# Patient Record
Sex: Female | Born: 1970 | Race: White | Hispanic: No | Marital: Married
Health system: Southern US, Community
[De-identification: ages and names within clinical notes are randomized; demographics above are authoritative.]

---

## 1998-05-07 ENCOUNTER — Inpatient Hospital Stay (HOSPITAL_COMMUNITY): Admission: AD | Admit: 1998-05-07 | Discharge: 1998-05-07 | Payer: Self-pay | Admitting: Obstetrics and Gynecology

## 1998-07-21 ENCOUNTER — Inpatient Hospital Stay (HOSPITAL_COMMUNITY): Admission: AD | Admit: 1998-07-21 | Discharge: 1998-07-21 | Payer: Self-pay | Admitting: Obstetrics and Gynecology

## 1998-07-24 ENCOUNTER — Inpatient Hospital Stay (HOSPITAL_COMMUNITY): Admission: AD | Admit: 1998-07-24 | Discharge: 1998-07-24 | Payer: Self-pay | Admitting: Obstetrics & Gynecology

## 1998-08-04 ENCOUNTER — Observation Stay (HOSPITAL_COMMUNITY): Admission: AD | Admit: 1998-08-04 | Discharge: 1998-08-05 | Payer: Self-pay | Admitting: Obstetrics and Gynecology

## 1998-08-06 ENCOUNTER — Inpatient Hospital Stay (HOSPITAL_COMMUNITY): Admission: AD | Admit: 1998-08-06 | Discharge: 1998-08-09 | Payer: Self-pay | Admitting: Obstetrics and Gynecology

## 1999-12-21 ENCOUNTER — Ambulatory Visit (HOSPITAL_COMMUNITY): Admission: RE | Admit: 1999-12-21 | Discharge: 1999-12-21 | Payer: Self-pay | Admitting: Unknown Physician Specialty

## 1999-12-21 ENCOUNTER — Encounter: Payer: Self-pay | Admitting: Obstetrics and Gynecology

## 2001-01-16 ENCOUNTER — Other Ambulatory Visit: Admission: RE | Admit: 2001-01-16 | Discharge: 2001-01-16 | Payer: Self-pay | Admitting: Obstetrics and Gynecology

## 2001-01-30 ENCOUNTER — Ambulatory Visit (HOSPITAL_COMMUNITY): Admission: RE | Admit: 2001-01-30 | Discharge: 2001-01-30 | Payer: Self-pay | Admitting: Obstetrics and Gynecology

## 2001-01-30 ENCOUNTER — Encounter: Payer: Self-pay | Admitting: Obstetrics and Gynecology

## 2002-08-28 ENCOUNTER — Other Ambulatory Visit: Admission: RE | Admit: 2002-08-28 | Discharge: 2002-08-28 | Payer: Self-pay | Admitting: Obstetrics and Gynecology

## 2002-11-12 ENCOUNTER — Inpatient Hospital Stay (HOSPITAL_COMMUNITY): Admission: AD | Admit: 2002-11-12 | Discharge: 2002-11-12 | Payer: Self-pay | Admitting: Obstetrics and Gynecology

## 2003-01-26 ENCOUNTER — Inpatient Hospital Stay (HOSPITAL_COMMUNITY): Admission: AD | Admit: 2003-01-26 | Discharge: 2003-01-26 | Payer: Self-pay | Admitting: Obstetrics and Gynecology

## 2003-03-05 ENCOUNTER — Inpatient Hospital Stay (HOSPITAL_COMMUNITY): Admission: AD | Admit: 2003-03-05 | Discharge: 2003-03-05 | Payer: Self-pay | Admitting: Obstetrics and Gynecology

## 2003-03-14 ENCOUNTER — Inpatient Hospital Stay (HOSPITAL_COMMUNITY): Admission: AD | Admit: 2003-03-14 | Discharge: 2003-03-14 | Payer: Self-pay | Admitting: Obstetrics and Gynecology

## 2003-03-16 ENCOUNTER — Inpatient Hospital Stay (HOSPITAL_COMMUNITY): Admission: AD | Admit: 2003-03-16 | Discharge: 2003-03-18 | Payer: Self-pay | Admitting: Obstetrics and Gynecology

## 2003-04-16 ENCOUNTER — Other Ambulatory Visit: Admission: RE | Admit: 2003-04-16 | Discharge: 2003-04-16 | Payer: Self-pay | Admitting: Obstetrics and Gynecology

## 2005-03-13 ENCOUNTER — Emergency Department (HOSPITAL_COMMUNITY): Admission: EM | Admit: 2005-03-13 | Discharge: 2005-03-13 | Payer: Self-pay | Admitting: Emergency Medicine

## 2005-03-30 ENCOUNTER — Encounter: Admission: RE | Admit: 2005-03-30 | Discharge: 2005-03-30 | Payer: Self-pay | Admitting: Internal Medicine

## 2005-09-17 ENCOUNTER — Emergency Department (HOSPITAL_COMMUNITY): Admission: EM | Admit: 2005-09-17 | Discharge: 2005-09-17 | Payer: Self-pay | Admitting: *Deleted

## 2005-10-13 ENCOUNTER — Encounter (INDEPENDENT_AMBULATORY_CARE_PROVIDER_SITE_OTHER): Payer: Self-pay | Admitting: Specialist

## 2005-10-13 ENCOUNTER — Ambulatory Visit (HOSPITAL_COMMUNITY): Admission: RE | Admit: 2005-10-13 | Discharge: 2005-10-13 | Payer: Self-pay | Admitting: Surgery

## 2005-11-16 ENCOUNTER — Ambulatory Visit (HOSPITAL_COMMUNITY): Admission: RE | Admit: 2005-11-16 | Discharge: 2005-11-16 | Payer: Self-pay | Admitting: Urology

## 2006-02-08 ENCOUNTER — Other Ambulatory Visit: Admission: RE | Admit: 2006-02-08 | Discharge: 2006-02-08 | Payer: Self-pay | Admitting: Obstetrics and Gynecology

## 2006-03-14 IMAGING — CR DG ABDOMEN 1V
2 series · 2 of 2 positions shown · non-contrast
Comparison: none

HISTORY: Renal calculi, pre lithotripsy for left renal stone

ABDOMEN ONE VIEW:
Small calculus lower pole left kidney, 4 mm greatest size.
Tiny right renal calculi also noted.
Surgical clips right upper quadrant.
No ureteral calcifications evident.
Bowel gas pattern normal.
Bones unremarkable.

[t abdomen supine (1 of 2)]
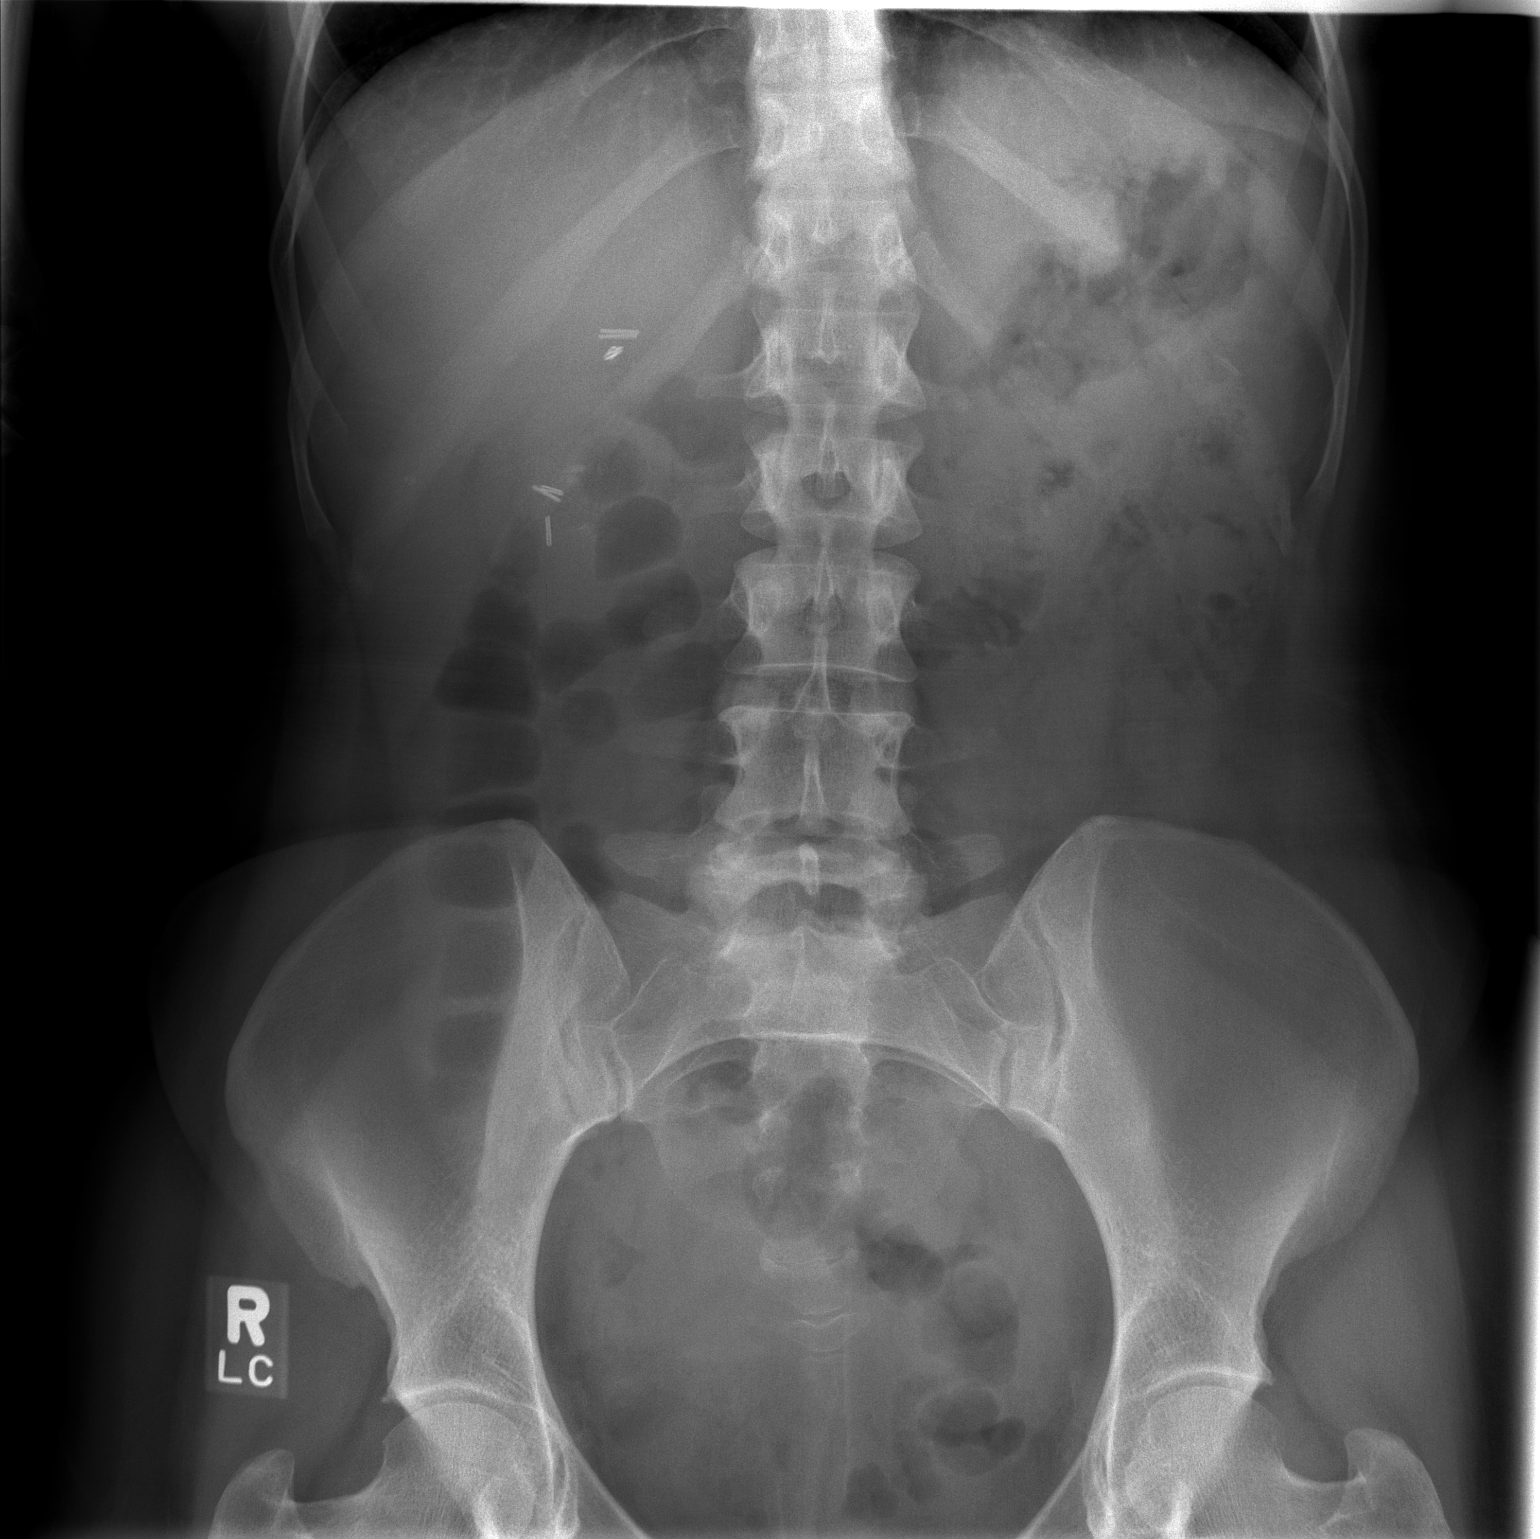

[t abdomen supine (2 of 2)]
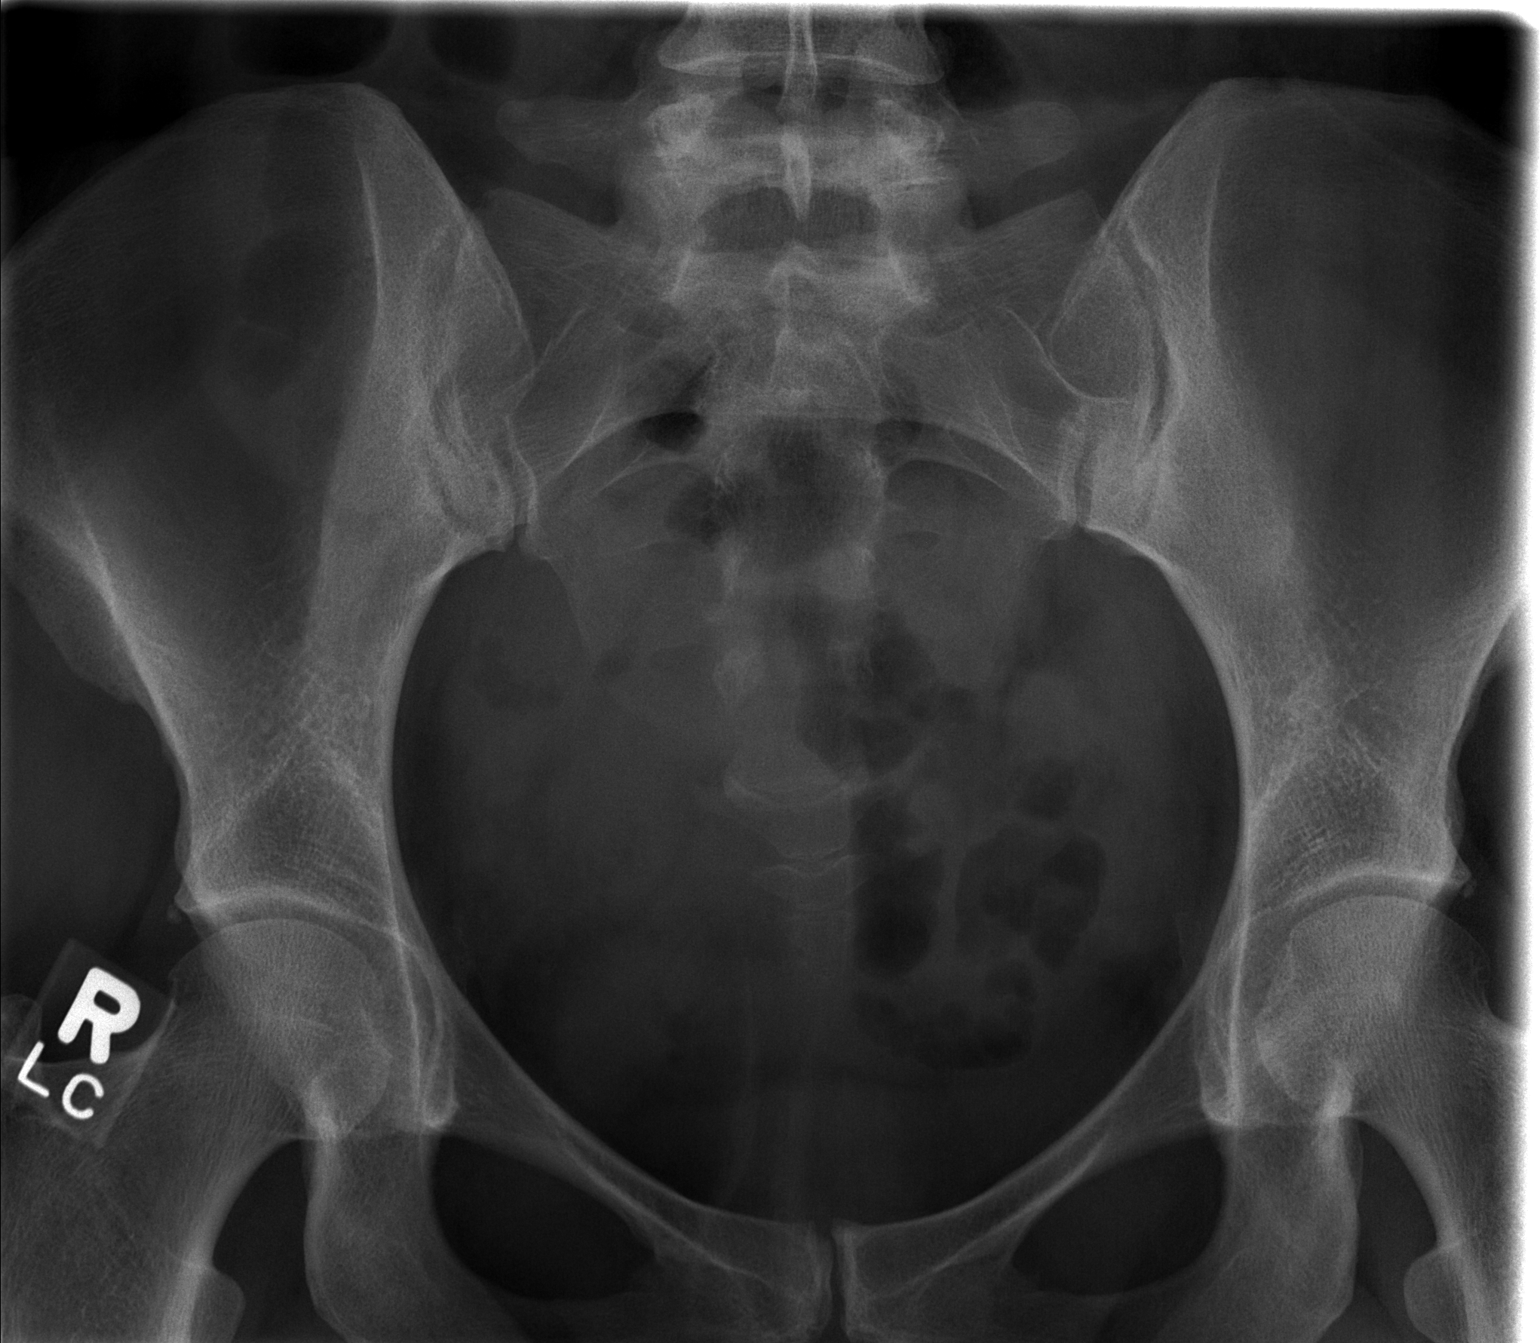

[2 of 2 positions shown; findings below may reference images not displayed]

IMPRESSION: Small bilateral renal calculi.

## 2008-02-12 ENCOUNTER — Encounter: Admission: RE | Admit: 2008-02-12 | Discharge: 2008-02-12 | Payer: Self-pay | Admitting: Family Medicine

## 2008-06-25 ENCOUNTER — Ambulatory Visit (HOSPITAL_COMMUNITY): Admission: RE | Admit: 2008-06-25 | Discharge: 2008-06-26 | Payer: Self-pay | Admitting: Obstetrics and Gynecology

## 2008-06-25 ENCOUNTER — Encounter (INDEPENDENT_AMBULATORY_CARE_PROVIDER_SITE_OTHER): Payer: Self-pay | Admitting: Obstetrics and Gynecology

## 2011-04-11 NOTE — Op Note (Signed)
Danielle Simpson, HALPERIN               ACCOUNT NO.:  [id number]   MEDICAL RECORD NO.:  [id number]          PATIENT TYPE:  INP   LOCATION:  9309                          FACILITY:  [facility]   PHYSICIAN:  Michelle L. Grewal, M.D.DATE OF BIRTH:  1971/11/17   DATE OF PROCEDURE:  06/25/2008  DATE OF DISCHARGE:                               OPERATIVE REPORT   PREOPERATIVE DIAGNOSES:  Pelvic pain and pelvic congestion.   POSTOPERATIVE DIAGNOSES:  Pelvic pain and pelvic congestion.   PROCEDURE:  Laparoscopic-assisted vaginal hysterectomy and left salpingo-  oophorectomy.   SURGEON:  Michelle L. Vincente Poli, MD.   ASSISTANT:  Zelphia Cairo, MD.   ANESTHESIA:  General.   FINDINGS:  Questionable adenomyosis and a varicosity of the left ovarian  vein.   SPECIMENS:  Uterus, cervix, left tube, and ovary sent to pathology.   ESTIMATED BLOOD LOSS:  100 mL.   COMPLICATIONS:  None.   DESCRIPTION OF PROCEDURE:  The patient was taken to the operating room.  She was intubated.  She was prepped and draped.  A Foley catheter was  inserted, a small infraumbilical incision was made, a Veress needle was  inserted, and pneumoperitoneum was performed.  The laparoscope was  introduced through the trocar sheath.  A small 5-mm trocar was placed  suprapubically under direct visualization.  Exam of the abdomen and  pelvis revealed the following, that the uterus was boggy, consistent  with adenomyosis.  No fibroids were seen.  No endometriosis was seen.  Right tube and ovary appeared normal.  She did have dilated veins around  the lower uterine segment and the cervix.  I did notice her left ovary  appeared normal, but she did have a dilated left ovarian vein.  This  seems to be consistent with ultrasound that was done preoperatively and  with her diagnosis of pelvic congestion.  I grasped the right tube and  ovary, and placed the EnSeal instrument just across the triple pedicle,  burned that and carried it down to  the round ligament with excellent  hemostasis.  On the left side, since we were removing her left tube and  ovary, identified the ureter, which was well away from my instruments.  Placed the EnSeal across the left IP and carried that down to the round  ligament with excellent hemostasis.  After this was done, we then  converted vaginally where a circumferential incision was made around the  cervix.  The posterior cul-de-sac was entered sharply using Mayo  scissors and the anterior cul-de-sac was entered sharply using  Metzenbaum scissors.  Curved Heaney clamps were placed across each  uterosacral cardinal ligament on either side.  Each pedicle was suture  ligated using 0 Vicryl suture.  We walked our way up the broad ligament  on each side, staying just beside the cervix and the uterus.  Each  pedicle was suture ligated using 0 Vicryl suture.  Once we reached the  level of the fundus, the uterus was retroflexed and delivered easily.  The remainder of the broad ligament was clamped on either side using  curved Heaney clamps and each  specimen was removed.  The pedicles were  secured using a free tie suture ligature of 0 Vicryl suture.  Hemostasis  was excellent.  The posterior cuff was closed from 3 to 9 o'clock in a  running locked stitch using 0 Vicryl.  The cuff was then closed  completely, anterior to posterior using 0 Vicryl in a running locked  stitch.  At this point, I changed my gloves and went back to the abdomen  and replaced the pneumoperitoneum, inserted the trocar, placed the  patient in Trendelenburg position, and irrigated the pelvis.  No  bleeding was noted from any site whatsoever.  The trocars were removed  after pneumoperitoneum was released.  The incision at the umbilicus was  closed with a 2-0 Vicryl in a figure-of-eight and then the skin was  closed at both sides with Dermabond skin adhesive.  All sponge, lap, and  instrument counts were correct x2.  The patient went to  recovery room in  stable condition.      Michelle L. Vincente Poli, M.D.  Electronically Signed     MLG/MEDQ  D:  06/25/2008  T:  06/25/2008  Job:  [id number]

## 2011-04-14 NOTE — Op Note (Signed)
Danielle Simpson, Danielle Simpson               ACCOUNT NO.:  [id number]   MEDICAL RECORD NO.:  [id number]          PATIENT TYPE:  AMB   LOCATION:  DAY                          FACILITY:  [facility]   PHYSICIAN:  Wilmon Arms. Corliss Skains, M.D. DATE OF BIRTH:  18-Jun-1971   DATE OF PROCEDURE:  10/13/2005  DATE OF DISCHARGE:                                 OPERATIVE REPORT   PREOPERATIVE DIAGNOSIS:  Biliary dyskinesia.   POSTOPERATIVE DIAGNOSIS:  Biliary dyskinesia.   PROCEDURE PERFORMED:  Laparoscopic cholecystectomy.   SURGEON:  Dr. Corliss Skains   ASSISTANT:  Dr. Thornton Dales   ANESTHESIA:  General endotracheal.   INDICATIONS:  The patient is a 40 year old female, who has had severe upper  abdominal pain associated with nausea.  She was evaluated with an ultrasound  which was normal.  She has followed up with Dr. Marny Lowenstein, who ordered a CCK  HIDA scan.  Her symptoms worsened with the CCK injection, and her  gallbladder ejection fraction was only 13%.  We discussed these findings in  the office, and I recommended elective laparoscopic cholecystectomy.   DESCRIPTION OF PROCEDURE:  The patient is brought to the operating room,  placed in supine position on the operating room table.  After an adequate  level of general endotracheal anesthesia was obtained, the patient's abdomen  was prepped with Betadine and draped in a sterile fashion.  She had been  given preoperative antibiotics.  The area just below her umbilicus was  infiltrated with 0.25% Marcaine.  She had a previous incision from a tubal  ligation.  We opened this incision, dissected down to the fascia.  The  fascia was opened vertically, and the peritoneal cavity was entered.  Zero  Vicryl was used to create a pursestring suture around the fascial opening.  The Hasson cannula was then inserted and secured with stay sutures.  Pneumoperitoneum was obtained by insufflating CO2, maintaining a maximum  pressure of 15 mmHg.  The patient was rotated in reverse  Trendelenburg  position, rotated to her left.  A 10 mm port was placed in the subxiphoid  position.  Two 5 mm ports were placed in the right upper quadrant.  There  were some filmy adhesions to the surface of the gallbladder.  These were  taken down with cautery.  There was a small bleeding vessel in the omentum  that was clipped.  The cystic duct was then circumferentially dissected,  ligated with clips, and divided.  We identified the cystic artery,  circumferentially dissected it, and ligated with clips and divided it.  Cautery was then used to remove the gallbladder from the liver bed.  Hemostasis was obtained with cautery.  The gallbladder was then removed  through the umbilical port.  Inspection of the liver bed showed no sign of  bleeding.  The ports were then removed under direct vision while releasing  the pneumoperitoneum.  The stay suture was used to close the umbilical  fascia.  All the port sites were infiltrated with  0.25% Marcaine.  Then 4-0 Monocryl was used to close the skin in  subcuticular fashion.  Steri-Strips and  clean dressings were applied.  The  patient was extubated and brought to the recovery room in stable condition.  All sponge, needle, and instrument counts were correct.      Wilmon Arms. Tsuei, M.D.  Electronically Signed     MKT/MEDQ  D:  10/13/2005  T:  10/13/2005  Job:  [id number]

## 2011-08-25 LAB — CBC
MCHC: 32.8
MCV: 80.4
Platelets: 201
RBC: 3.55 — ABNORMAL LOW
WBC: 10.7 — ABNORMAL HIGH

## 2012-01-29 ENCOUNTER — Other Ambulatory Visit: Payer: Self-pay | Admitting: Family Medicine

## 2012-01-29 DIAGNOSIS — R1310 Dysphagia, unspecified: Secondary | ICD-10-CM

## 2012-01-31 ENCOUNTER — Ambulatory Visit
Admission: RE | Admit: 2012-01-31 | Discharge: 2012-01-31 | Disposition: A | Discharge status: Home / Self Care | Payer: Managed Care, Other (non HMO) | Source: Ambulatory Visit | Attending: Family Medicine | Admitting: Family Medicine

## 2012-01-31 DIAGNOSIS — R1310 Dysphagia, unspecified: Secondary | ICD-10-CM

## 2012-12-26 ENCOUNTER — Other Ambulatory Visit: Payer: Self-pay | Admitting: Obstetrics and Gynecology

## 2014-02-02 ENCOUNTER — Other Ambulatory Visit: Payer: Self-pay | Admitting: Physician Assistant

## 2014-06-11 ENCOUNTER — Other Ambulatory Visit: Payer: Self-pay | Admitting: Obstetrics and Gynecology

## 2014-06-12 LAB — CYTOLOGY - PAP

## 2014-09-23 ENCOUNTER — Other Ambulatory Visit: Payer: Self-pay | Admitting: Dermatology

## 2019-10-08 ENCOUNTER — Other Ambulatory Visit: Payer: Self-pay

## 2019-10-08 DIAGNOSIS — Z20822 Contact with and (suspected) exposure to covid-19: Secondary | ICD-10-CM

## 2019-10-10 LAB — NOVEL CORONAVIRUS, NAA: SARS-CoV-2, NAA: NOT DETECTED

## 2020-02-01 ENCOUNTER — Ambulatory Visit: Payer: Managed Care, Other (non HMO) | Attending: Internal Medicine

## 2020-02-01 DIAGNOSIS — Z23 Encounter for immunization: Secondary | ICD-10-CM

## 2020-02-01 NOTE — Progress Notes (Signed)
   Covid-19 Vaccination Clinic  Name:  Danielle Simpson    MRN: [id number] DOB: [birth year]  02/01/2020  [name] was observed post Covid-19 immunization for 15 minutes without incident. She was provided with Vaccine Information Sheet and instruction to access the V-Safe system.   [name] was instructed to call 911 with any severe reactions post vaccine: Marland Kitchen Difficulty breathing  . Swelling of face and throat  . A fast heartbeat  . A bad rash all over body  . Dizziness and weakness   Immunizations Administered    Name Date Dose VIS Date Route   Pfizer COVID-19 Vaccine 02/01/2020  4:15 PM 0.3 mL 11/07/2019 Intramuscular   Manufacturer: [employer], [employer]   Lot: [id number]   NDC: [address]-5

## 2020-02-22 ENCOUNTER — Ambulatory Visit: Payer: Managed Care, Other (non HMO) | Attending: Internal Medicine

## 2020-02-22 DIAGNOSIS — Z23 Encounter for immunization: Secondary | ICD-10-CM

## 2020-02-22 NOTE — Progress Notes (Signed)
   Covid-19 Vaccination Clinic  Name:  Danielle Simpson    MRN: [id number] DOB: [birth year]  02/22/2020  Danielle Simpson was observed post Covid-19 immunization for 15 minutes without incident. She was provided with Vaccine Information Sheet and instruction to access the V-Safe system.   Danielle Simpson was instructed to call 911 with any severe reactions post vaccine: Marland Kitchen Difficulty breathing  . Swelling of face and throat  . A fast heartbeat  . A bad rash all over body  . Dizziness and weakness   Immunizations Administered    Name Date Dose VIS Date Route   Pfizer COVID-19 Vaccine 02/22/2020  2:09 PM 0.3 mL 11/07/2019 Intramuscular   Manufacturer: [employer], [employer]   Lot: [id number]   NDC: [address]-3

## 2020-06-07 ENCOUNTER — Telehealth: Payer: Self-pay

## 2020-06-07 NOTE — Telephone Encounter (Signed)
NOTES ON FILE FROM MICHELLE GREWAL [phone] SENT REFERRAL TO SCHEDULING 

## 2020-07-01 ENCOUNTER — Ambulatory Visit: Payer: Managed Care, Other (non HMO) | Admitting: Cardiovascular Disease

## 2020-07-30 ENCOUNTER — Ambulatory Visit: Payer: Managed Care, Other (non HMO) | Admitting: Cardiovascular Disease

## 2020-11-25 ENCOUNTER — Other Ambulatory Visit: Payer: Managed Care, Other (non HMO)

## 2021-01-24 ENCOUNTER — Other Ambulatory Visit: Payer: Self-pay | Admitting: Obstetrics and Gynecology

## 2021-01-24 DIAGNOSIS — R928 Other abnormal and inconclusive findings on diagnostic imaging of breast: Secondary | ICD-10-CM

## 2021-02-02 ENCOUNTER — Encounter: Payer: Self-pay | Admitting: General Practice

## 2021-02-04 ENCOUNTER — Other Ambulatory Visit: Payer: Self-pay

## 2021-02-04 ENCOUNTER — Ambulatory Visit
Admission: RE | Admit: 2021-02-04 | Discharge: 2021-02-04 | Disposition: A | Discharge status: Home / Self Care | Payer: Managed Care, Other (non HMO) | Source: Ambulatory Visit | Attending: Obstetrics and Gynecology | Admitting: Obstetrics and Gynecology

## 2021-02-04 ENCOUNTER — Other Ambulatory Visit: Payer: Self-pay | Admitting: Obstetrics and Gynecology

## 2021-02-04 ENCOUNTER — Ambulatory Visit
Admission: RE | Admit: 2021-02-04 | Discharge: 2021-02-04 | Disposition: A | Discharge status: Home / Self Care | Payer: BC Managed Care – PPO | Source: Ambulatory Visit | Attending: Obstetrics and Gynecology | Admitting: Obstetrics and Gynecology

## 2021-02-04 DIAGNOSIS — R928 Other abnormal and inconclusive findings on diagnostic imaging of breast: Secondary | ICD-10-CM

## 2021-08-09 ENCOUNTER — Other Ambulatory Visit: Payer: Self-pay

## 2021-08-09 ENCOUNTER — Other Ambulatory Visit: Payer: Self-pay | Admitting: Obstetrics and Gynecology

## 2021-08-09 ENCOUNTER — Ambulatory Visit
Admission: RE | Admit: 2021-08-09 | Discharge: 2021-08-09 | Disposition: A | Discharge status: Home / Self Care | Payer: BC Managed Care – PPO | Source: Ambulatory Visit | Attending: Obstetrics and Gynecology | Admitting: Obstetrics and Gynecology

## 2021-08-09 DIAGNOSIS — R928 Other abnormal and inconclusive findings on diagnostic imaging of breast: Secondary | ICD-10-CM

## 2022-02-07 ENCOUNTER — Ambulatory Visit
Admission: RE | Admit: 2022-02-07 | Discharge: 2022-02-07 | Disposition: A | Discharge status: Home / Self Care | Payer: BC Managed Care – PPO | Source: Ambulatory Visit | Attending: Obstetrics and Gynecology | Admitting: Obstetrics and Gynecology

## 2022-02-07 DIAGNOSIS — R928 Other abnormal and inconclusive findings on diagnostic imaging of breast: Secondary | ICD-10-CM

## 2022-06-05 IMAGING — MG DIGITAL DIAGNOSTIC BILAT W/ TOMO W/ CAD
8 series · 8 of 24 positions shown · non-contrast
Comparison: Previous exam(s).

CLINICAL DATA: 50-year-old female presenting for annual bilateral
mammogram and 1 year follow-up of a probably benign right breast
mass.

EXAM:
DIGITAL DIAGNOSTIC BILATERAL MAMMOGRAM WITH TOMOSYNTHESIS AND CAD;
ULTRASOUND RIGHT BREAST LIMITED
TECHNIQUE: Bilateral digital diagnostic mammography and breast tomosynthesis
was performed. The images were evaluated with computer-aided
detection.; Targeted ultrasound examination of the right breast was
performed

[R CC synth-2D]
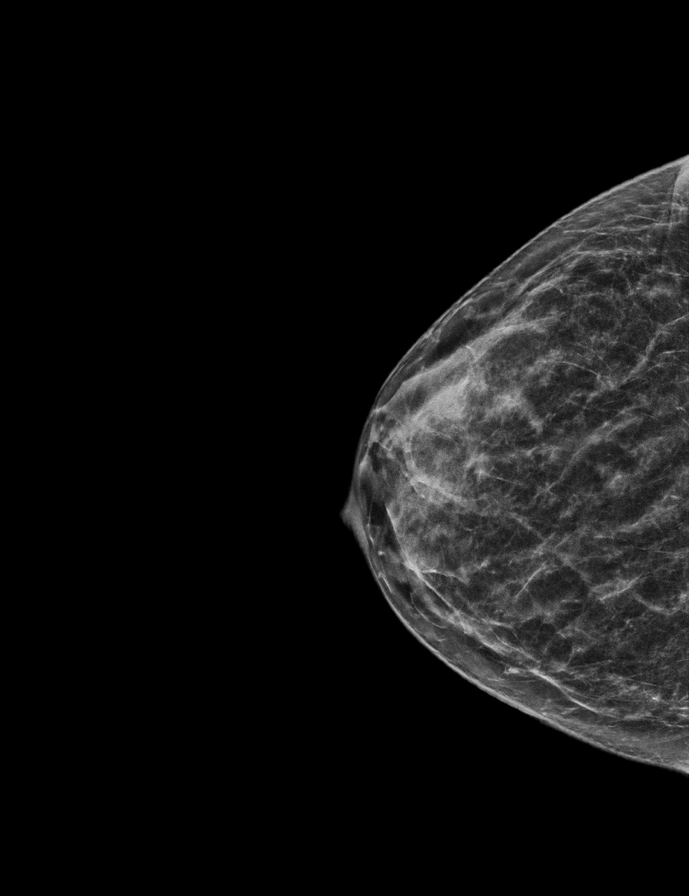

[L MLO synth-2D]
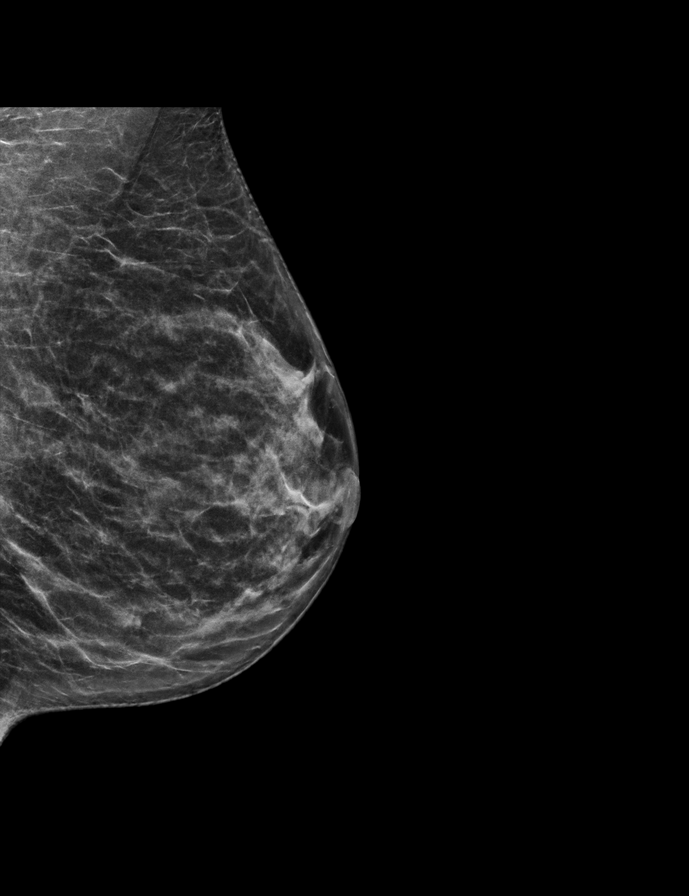

[L CC synth-2D]
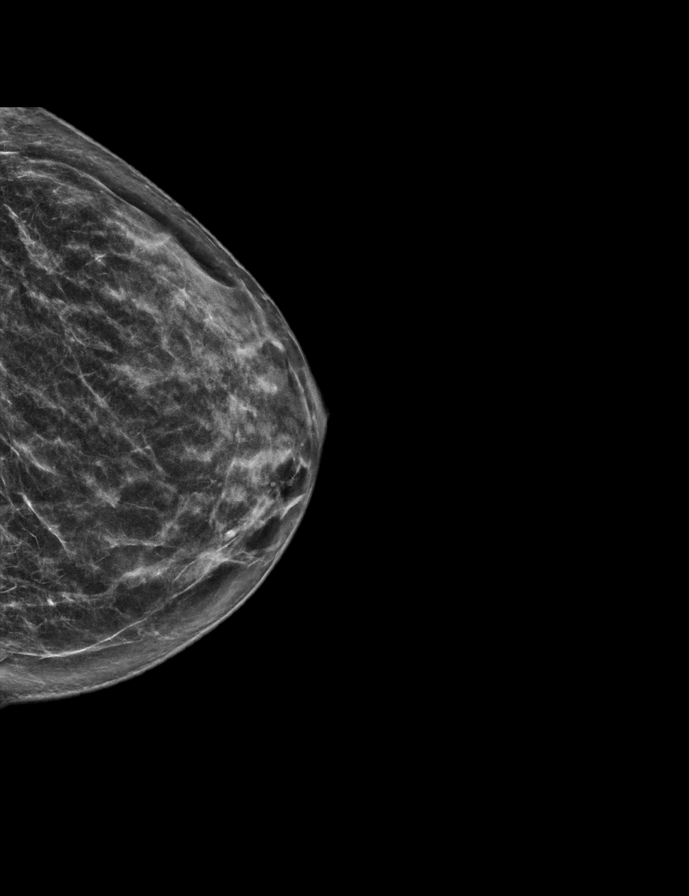

[R MLO synth-2D]
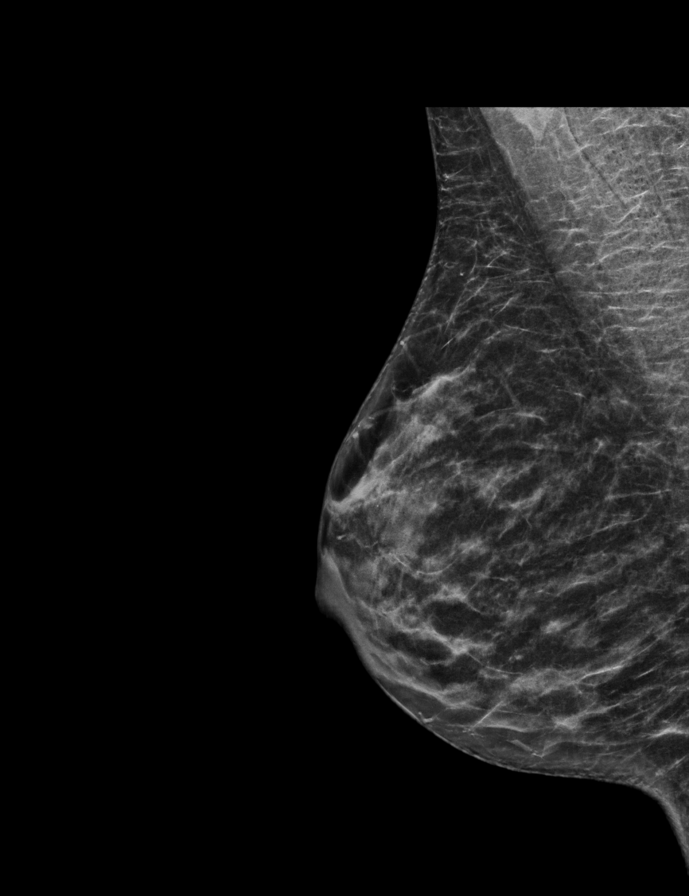

[R CC tomo · tomo slice 27/54.0]
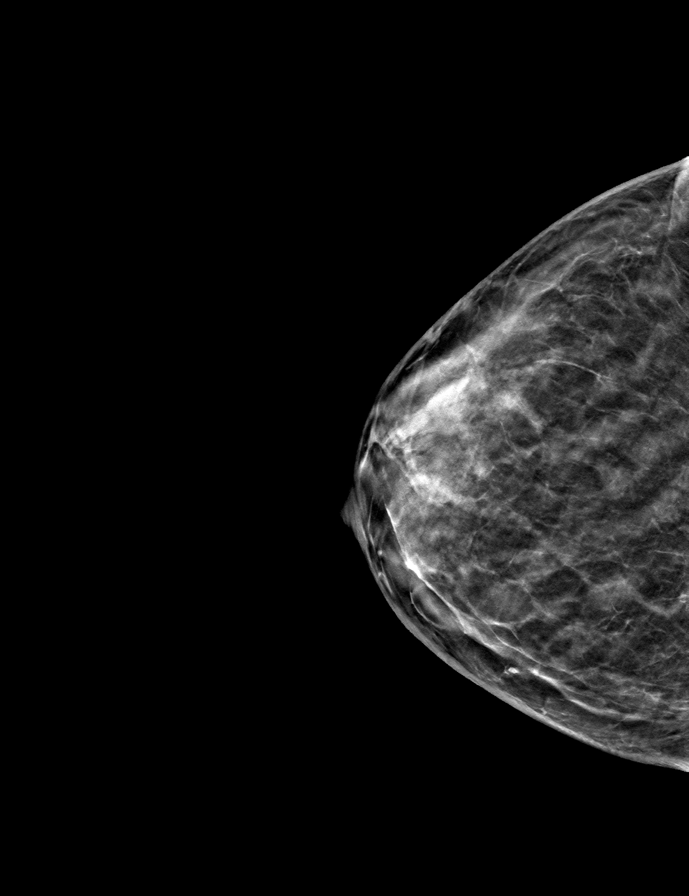

[L CC tomo · tomo slice 29/56.0]
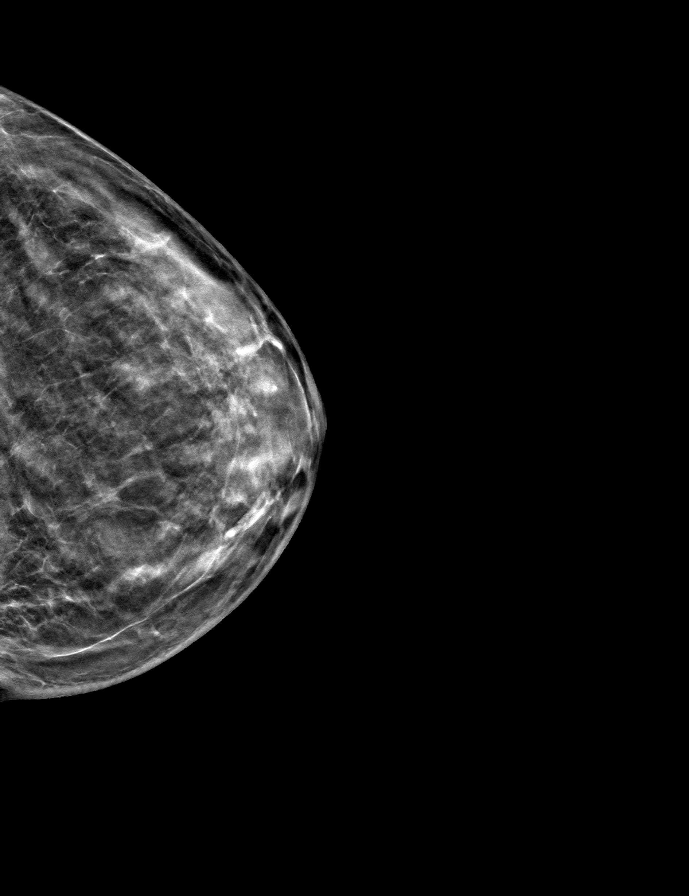

[R MLO tomo · tomo slice 27/53.0]
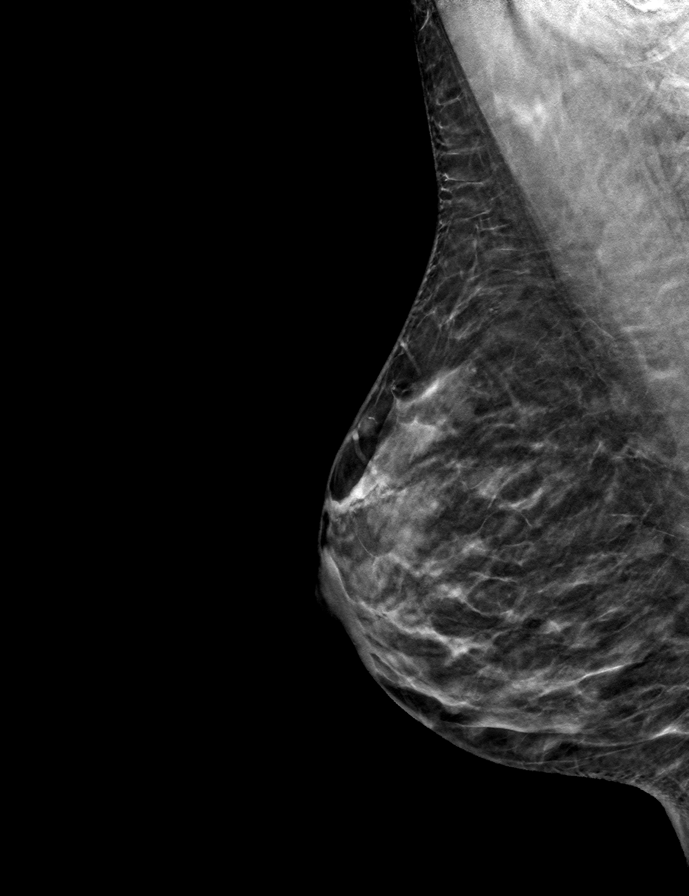

[L MLO tomo · tomo slice 29/56.0]
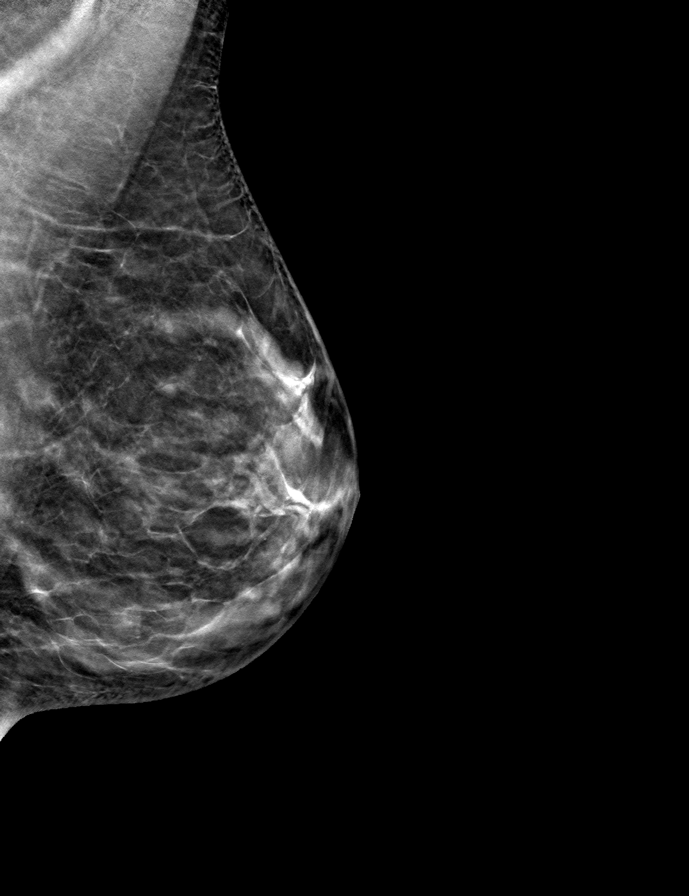

[8 of 24 positions shown; findings below may reference images not displayed]

ACR Breast Density Category c: The breast tissue is heterogeneously
dense, which may obscure small masses.
FINDINGS: Previously noted asymmetry in the inferior central right breast has
resolved on today's mammographic views. Otherwise, no new or
suspicious findings in either breast.

Targeted ultrasound is performed, showing no focal or suspicious
sonographic findings along the 7 o'clock axis of the right breast.
There has been interval resolution of the previously demonstrated
cystic mass.
IMPRESSION: 1. No mammographic evidence of malignancy in either breast.
2. Interval resolution of the patient's previous cystic right breast
mass. No further follow-up required.

RECOMMENDATION:
Screening mammogram in one year.(Code:[id number])

I have discussed the findings and recommendations with the patient.
If applicable, a reminder letter will be sent to the patient
regarding the next appointment.

BI-RADS CATEGORY  1: Negative.

## 2023-07-09 ENCOUNTER — Other Ambulatory Visit: Payer: Self-pay | Admitting: Obstetrics and Gynecology

## 2023-07-09 DIAGNOSIS — R102 Pelvic and perineal pain: Secondary | ICD-10-CM

## 2023-07-31 ENCOUNTER — Other Ambulatory Visit: Payer: BC Managed Care – PPO
